# Patient Record
Sex: Female | Born: 1995 | Race: Asian | Hispanic: No | Marital: Single | Smoking: Never smoker
Health system: Southern US, Community
[De-identification: ages and names within clinical notes are randomized; demographics above are authoritative.]

## PROBLEM LIST (undated history)

## (undated) DIAGNOSIS — L42 Pityriasis rosea: Secondary | ICD-10-CM

## (undated) DIAGNOSIS — R87619 Unspecified abnormal cytological findings in specimens from cervix uteri: Secondary | ICD-10-CM

## (undated) HISTORY — PX: FINGER SURGERY: SHX640

## (undated) HISTORY — DX: Pityriasis rosea: L42

## (undated) HISTORY — DX: Unspecified abnormal cytological findings in specimens from cervix uteri: R87.619

---
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
## 2015-01-25 ENCOUNTER — Other Ambulatory Visit: Payer: Self-pay | Admitting: Nurse Practitioner

## 2015-01-25 NOTE — Telephone Encounter (Signed)
Medication refill request: Junel Fe Last AEX:  02/15/14 with TL Next AEX: 04/20/15 with PG Last MMG (if hormonal medication request):  Refill authorized: please advise

## 2015-04-18 ENCOUNTER — Other Ambulatory Visit: Payer: Self-pay | Admitting: Certified Nurse Midwife

## 2015-04-18 NOTE — Telephone Encounter (Signed)
Medication refill request: Junel FE 1mg  - 20 mcg Last AEX:  02/15/2014 [facility] Next AEX: 04/20/2015 PG Last MMG (if hormonal medication request): None Refill authorized: 01/25/2015 Junel FE 1mg -20mcg # 84 tabs 0 Refills  DL   Today: 84 tabs 0 Refills

## 2015-04-20 ENCOUNTER — Ambulatory Visit (INDEPENDENT_AMBULATORY_CARE_PROVIDER_SITE_OTHER): Payer: BLUE CROSS/BLUE SHIELD | Admitting: Nurse Practitioner

## 2015-04-20 ENCOUNTER — Encounter: Payer: Self-pay | Admitting: Nurse Practitioner

## 2015-04-20 VITALS — BP 104/70 | HR 84 | Ht 62.0 in | Wt 120.0 lb

## 2015-04-20 DIAGNOSIS — Z113 Encounter for screening for infections with a predominantly sexual mode of transmission: Secondary | ICD-10-CM | POA: Diagnosis not present

## 2015-04-20 DIAGNOSIS — Z01419 Encounter for gynecological examination (general) (routine) without abnormal findings: Secondary | ICD-10-CM | POA: Diagnosis not present

## 2015-04-20 DIAGNOSIS — Z0000 Encounter for general adult medical examination without abnormal findings: Secondary | ICD-10-CM | POA: Diagnosis not present

## 2015-04-20 LAB — POCT URINALYSIS DIPSTICK
Bilirubin, UA: NEGATIVE
Blood, UA: NEGATIVE
Glucose, UA: NEGATIVE
Ketones, UA: NEGATIVE
Leukocytes, UA: NEGATIVE
Nitrite, UA: NEGATIVE
Protein, UA: NEGATIVE
Urobilinogen, UA: NEGATIVE
pH, UA: 6

## 2015-04-20 MED ORDER — NORETHIN ACE-ETH ESTRAD-FE 1-20 MG-MCG PO TABS
1.0000 | ORAL_TABLET | Freq: Every day | ORAL | Status: DC
Start: 2015-04-20 — End: 2016-04-25

## 2015-04-20 NOTE — Progress Notes (Signed)
Patient ID: Becky Snyder, female   DOB: June 03, 1995, 19 y.o.   MRN: [id number]  19 y.o. G0P0 Single  Asian Fe here for annual exam.  Menses is lighter and last 4- 5 days. OCP has helps with cramps, still some PMS.  Same partner. Some right breast tenderness with menses and increase in caffiene. She has FCB changes bilaterally right> left.  Patient's last menstrual period was 03/30/2015 (exact date).          Sexually active: Yes.    The current method of family planning is OCP (estrogen/progesterone) and condoms most of the time.    Exercising: No.  The patient does not participate in regular exercise at present. Smoker:  no  Health Maintenance: Pap:  Never TDaP:  10/15/2007 Gardasil: completed 10/2013 Labs: will get STD's  Urine: Negative    reports that she has never smoked. She has never used smokeless tobacco. She reports that she does not drink alcohol or use illicit drugs.  History reviewed. No pertinent past medical history.  Past Surgical History  Procedure Laterality Date  . Mouth surgery  July 2015    Gum Graft    Current Outpatient Prescriptions  Medication Sig Dispense Refill  . albuterol (PROVENTIL HFA;VENTOLIN HFA) 108 (90 BASE) MCG/ACT inhaler Inhale 2 puffs into the lungs every 6 (six) hours as needed for wheezing or shortness of breath.    . EPINEPHrine 0.3 mg/0.3 mL IJ SOAJ injection Inject 0.3 mLs (0.3 mg total) into the muscle once. 1 Device prn  . Fluticasone-Salmeterol (ADVAIR DISKUS) 100-50 MCG/DOSE AEPB Inhale 1 puff into the lungs daily. 60 each 0  . loratadine (CLARITIN) 10 MG tablet Take 10 mg by mouth daily.    . norethindrone-ethinyl estradiol (JUNEL FE 1/20) 1-20 MG-MCG tablet Take 1 tablet by mouth daily. 3 Package 4   No current facility-administered medications for this visit.    Family History  Problem Relation Age of Onset  . Adopted: Yes  . Family history unknown: Yes    ROS:  Pertinent items are noted in HPI.  Otherwise, a comprehensive  ROS was negative.  Exam:   BP 104/70 mmHg  Pulse 84  Ht  (1.575 m)  Wt 120 lb (54.432 kg)  BMI 21.94 kg/m2  LMP 03/30/2015 (Exact Date) Height:  (157.5 cm) Ht Readings from Last 3 Encounters:  04/20/15  (1.575 m) (18 %*, Z = -0.90)  12/29/14 5' 2.25" (1.581 m) (21 %*, Z = -0.80)  11/02/14 5' 2.25" (1.581 m) (21 %*, Z = -0.79)   * Growth percentiles are based on CDC 2-20 Years data.    General appearance: alert, cooperative and appears stated age Head: Normocephalic, without obvious abnormality, atraumatic Neck: no adenopathy, supple, symmetrical, trachea midline and thyroid normal to inspection and palpation Lungs: clear to auscultation bilaterally Breasts: normal appearance, no masses or tenderness, with FCB chnages R> L.  Right breast is also a size larger than the left.   The area of previous concern is not palpable. Heart: regular rate and rhythm Abdomen: soft, non-tender; no masses,  no organomegaly Extremities: extremities normal, atraumatic, no cyanosis or edema Skin: Skin color, texture, turgor normal. No rashes or lesions Lymph nodes: Cervical, supraclavicular, and axillary nodes normal. No abnormal inguinal nodes palpated Neurologic: Grossly normal   Pelvic: External genitalia:  no lesions              Urethra:  normal appearing urethra with no masses, tenderness or lesions  Bartholin's and Skene's: normal                 Vagina: normal appearing vagina with normal color and discharge, no lesions              Cervix: anteverted              Pap taken: No. Bimanual Exam:  Uterus:  normal size, contour, position, consistency, mobility, non-tender              Adnexa: no mass, fullness, tenderness               Rectovaginal: Confirms               Anus:  normal sphincter tone, no lesions  Chaperone present: yes  A:  Well Woman with normal exam  OCP for contraception  R/O STD's   P:   Reviewed health and wellness pertinent to  exam  Pap smear as above  Refill on Loestrin for a year  Continue to monitor changes with her breast  Counseled on breast self exam, use and side effects of OCP's, adequate intake of calcium and vitamin D, diet and exercise return annually or prn  An After Visit Summary was printed and given to the patient.

## 2015-04-20 NOTE — Patient Instructions (Signed)
General topics  Next pap or exam is  due in 1 year Take a Women's multivitamin Take 1200 mg. of calcium daily - prefer dietary If any concerns in interim to call back  Breast Self-Awareness Practicing breast self-awareness may pick up problems early, prevent significant medical complications, and possibly save your life. By practicing breast self-awareness, you can become familiar with how your breasts look and feel and if your breasts are changing. This allows you to notice changes early. It can also offer you some reassurance that your breast health is good. One way to learn what is normal for your breasts and whether your breasts are changing is to do a breast self-exam. If you find a lump or something that was not present in the past, it is best to contact your caregiver right away. Other findings that should be evaluated by your caregiver include nipple discharge, especially if it is bloody; skin changes or reddening; areas where the skin seems to be pulled in (retracted); or new lumps and bumps. Breast pain is seldom associated with cancer (malignancy), but should also be evaluated by a caregiver. BREAST SELF-EXAM The best time to examine your breasts is 5 7 days after your menstrual period is over.  ExitCare Patient Information 2013 [employer].   Exercise to Stay Healthy Exercise helps you become and stay healthy. EXERCISE IDEAS AND TIPS Choose exercises that:  You enjoy.  Fit into your day. You do not need to exercise really hard to be healthy. You can do exercises at a slow or medium level and stay healthy. You can:  Stretch before and after working out.  Try yoga, Pilates, or tai chi.  Lift weights.  Walk fast, swim, jog, run, climb stairs, bicycle, dance, or rollerskate.  Take aerobic classes. Exercises that burn about 150 calories:  Running 1  miles in 15 minutes.  Playing volleyball for 45 to 60 minutes.  Washing and waxing a car for 45 to 60  minutes.  Playing touch football for 45 minutes.  Walking 1  miles in 35 minutes.  Pushing a stroller 1  miles in 30 minutes.  Playing basketball for 30 minutes.  Raking leaves for 30 minutes.  Bicycling 5 miles in 30 minutes.  Walking 2 miles in 30 minutes.  Dancing for 30 minutes.  Shoveling snow for 15 minutes.  Swimming laps for 20 minutes.  Walking up stairs for 15 minutes.  Bicycling 4 miles in 15 minutes.  Gardening for 30 to 45 minutes.  Jumping rope for 15 minutes.  Washing windows or floors for 45 to 60 minutes. Document Released: 05/31/2010 Document Revised: 07/21/2011 Document Reviewed: 05/31/2010 ExitCare Patient Information 2013 [employer].   Other topics ( that may be useful information):    Sexually Transmitted Disease Sexually transmitted disease (STD) refers to any infection that is passed from person to person during sexual activity. This may happen by way of saliva, semen, blood, vaginal mucus, or urine. Common STDs include:  Gonorrhea.  Chlamydia.  Syphilis.  HIV/AIDS.  Genital herpes.  Hepatitis B and C.  Trichomonas.  Human papillomavirus (HPV).  Pubic lice. CAUSES  An STD may be spread by bacteria, virus, or parasite. A person can get an STD by:  Sexual intercourse with an infected person.  Sharing sex toys with an infected person.  Sharing needles with an infected person.  Having intimate contact with the genitals, mouth, or rectal areas of an infected person. SYMPTOMS  Some people may not have any symptoms, but   they can still pass the infection to others. Different STDs have different symptoms. Symptoms include:  Painful or bloody urination.  Pain in the pelvis, abdomen, vagina, anus, throat, or eyes.  Skin rash, itching, irritation, growths, or sores (lesions). These usually occur in the genital or anal area.  Abnormal vaginal discharge.  Penile discharge in men.  Soft, flesh-colored skin growths in the  genital or anal area.  Fever.  Pain or bleeding during sexual intercourse.  Swollen glands in the groin area.  Yellow skin and eyes (jaundice). This is seen with hepatitis. DIAGNOSIS  To make a diagnosis, your caregiver may:  Take a medical history.  Perform a physical exam.  Take a specimen (culture) to be examined.  Examine a sample of discharge under a microscope.  Perform blood test TREATMENT   Chlamydia, gonorrhea, trichomonas, and syphilis can be cured with antibiotic medicine.  Genital herpes, hepatitis, and HIV can be treated, but not cured, with prescribed medicines. The medicines will lessen the symptoms.  Genital warts from HPV can be treated with medicine or by freezing, burning (electrocautery), or surgery. Warts may come back.  HPV is a virus and cannot be cured with medicine or surgery.However, abnormal areas may be followed very closely by your caregiver and may be removed from the cervix, vagina, or vulva through office procedures or surgery. If your diagnosis is confirmed, your recent sexual partners need treatment. This is true even if they are symptom-free or have a negative culture or evaluation. They should not have sex until their caregiver says it is okay. HOME CARE INSTRUCTIONS  All sexual partners should be informed, tested, and treated for all STDs.  Take your antibiotics as directed. Finish them even if you start to feel better.  Only take over-the-counter or prescription medicines for pain, discomfort, or fever as directed by your caregiver.  Rest.  Eat a balanced diet and drink enough fluids to keep your urine clear or pale yellow.  Do not have sex until treatment is completed and you have followed up with your caregiver. STDs should be checked after treatment.  Keep all follow-up appointments, Pap tests, and blood tests as directed by your caregiver.  Only use latex condoms and water-soluble lubricants during sexual activity. Do not use  petroleum jelly or oils.  Avoid alcohol and illegal drugs.  Get vaccinated for HPV and hepatitis. If you have not received these vaccines in the past, talk to your caregiver about whether one or both might be right for you.  Avoid risky sex practices that can break the skin. The only way to avoid getting an STD is to avoid all sexual activity.Latex condoms and dental dams (for oral sex) will help lessen the risk of getting an STD, but will not completely eliminate the risk. SEEK MEDICAL CARE IF:   You have a fever.  You have any new or worsening symptoms. Document Released: 07/19/2002 Document Revised: 07/21/2011 Document Reviewed: 07/26/2010 [facility] Patient Information 2013 [employer].    Domestic Abuse You are being battered or abused if someone close to you hits, pushes, or physically hurts you in any way. You also are being abused if you are forced into activities. You are being sexually abused if you are forced to have sexual contact of any kind. You are being emotionally abused if you are made to feel worthless or if you are constantly threatened. It is important to remember that help is available. No one has the right to abuse you. PREVENTION OF FURTHER  ABUSE  Learn the warning signs of danger. This varies with situations but may include: the use of alcohol, threats, isolation from friends and family, or forced sexual contact. Leave if you feel that violence is going to occur.  If you are attacked or beaten, report it to the police so the abuse is documented. You do not have to press charges. The police can protect you while you or the attackers are leaving. Get the officer's name and badge number and a copy of the report.  Find someone you can trust and tell them what is happening to you: your caregiver, a nurse, clergy member, close friend or family member. Feeling ashamed is natural, but remember that you have done nothing wrong. No one deserves abuse. Document Released:  04/25/2000 Document Revised: 07/21/2011 Document Reviewed: 07/04/2010 ExitCare Patient Information 2013 [employer].    How Much is Too Much Alcohol? Drinking too much alcohol can cause injury, accidents, and health problems. These types of problems can include:   Car crashes.  Falls.  Family fighting (domestic violence).  Drowning.  Fights.  Injuries.  Burns.  Damage to certain organs.  Having a baby with birth defects. ONE DRINK CAN BE TOO MUCH WHEN YOU ARE:  Working.  Pregnant or breastfeeding.  Taking medicines. Ask your doctor.  Driving or planning to drive. If you or someone you know has a drinking problem, get help from a doctor.  Document Released: 02/22/2009 Document Revised: 07/21/2011 Document Reviewed: 02/22/2009 ExitCare Patient Information 2013 [employer].   Smoking Hazards Smoking cigarettes is extremely bad for your health. Tobacco smoke has over 200 known poisons in it. There are over 60 chemicals in tobacco smoke that cause cancer. Some of the chemicals found in cigarette smoke include:   Cyanide.  Benzene.  Formaldehyde.  Methanol (wood alcohol).  Acetylene (fuel used in welding torches).  Ammonia. Cigarette smoke also contains the poisonous gases nitrogen oxide and carbon monoxide.  Cigarette smokers have an increased risk of many serious medical problems and Smoking causes approximately:  90% of all lung cancer deaths in men.  80% of all lung cancer deaths in women.  90% of deaths from chronic obstructive lung disease. Compared with nonsmokers, smoking increases the risk of:  Coronary heart disease by 2 to 4 times.  Stroke by 2 to 4 times.  Men developing lung cancer by 23 times.  Women developing lung cancer by 13 times.  Dying from chronic obstructive lung diseases by 12 times.  . Smoking is the most preventable cause of death and disease in our society.  WHY IS SMOKING ADDICTIVE?  Nicotine is the chemical  agent in tobacco that is capable of causing addiction or dependence.  When you smoke and inhale, nicotine is absorbed rapidly into the bloodstream through your lungs. Nicotine absorbed through the lungs is capable of creating a powerful addiction. Both inhaled and non-inhaled nicotine may be addictive.  Addiction studies of cigarettes and spit tobacco show that addiction to nicotine occurs mainly during the teen years, when young people begin using tobacco products. WHAT ARE THE BENEFITS OF QUITTING?  There are many health benefits to quitting smoking.   Likelihood of developing cancer and heart disease decreases. Health improvements are seen almost immediately.  Blood pressure, pulse rate, and breathing patterns start returning to normal soon after quitting. QUITTING SMOKING   American Lung Association - 1-800-LUNGUSA  American Cancer Society - 1-800-ACS-2345 Document Released: 06/05/2004 Document Revised: 07/21/2011 Document Reviewed: 02/07/2009 ExitCare Patient Information 2013 ExitCare[employer]   LLC.   Stress Management Stress is a state of physical or mental tension that often results from changes in your life or normal routine. Some common causes of stress are:  Death of a loved one.  Injuries or severe illnesses.  Getting fired or changing jobs.  Moving into a new home. Other causes may be:  Sexual problems.  Business or financial losses.  Taking on a large debt.  Regular conflict with someone at home or at work.  Constant tiredness from lack of sleep. It is not just bad things that are stressful. It may be stressful to:  Win the lottery.  Get married.  Buy a new car. The amount of stress that can be easily tolerated varies from person to person. Changes generally cause stress, regardless of the types of change. Too much stress can affect your health. It may lead to physical or emotional problems. Too little stress (boredom) may also become stressful. SUGGESTIONS TO  REDUCE STRESS:  Talk things over with your family and friends. It often is helpful to share your concerns and worries. If you feel your problem is serious, you may want to get help from a professional counselor.  Consider your problems one at a time instead of lumping them all together. Trying to take care of everything at once may seem impossible. List all the things you need to do and then start with the most important one. Set a goal to accomplish 2 or 3 things each day. If you expect to do too many in a single day you will naturally fail, causing you to feel even more stressed.  Do not use alcohol or drugs to relieve stress. Although you may feel better for a short time, they do not remove the problems that caused the stress. They can also be habit forming.  Exercise regularly - at least 3 times per week. Physical exercise can help to relieve that "uptight" feeling and will relax you.  The shortest distance between despair and hope is often a good night's sleep.  Go to bed and get up on time allowing yourself time for appointments without being rushed.  Take a short "time-out" period from any stressful situation that occurs during the day. Close your eyes and take some deep breaths. Starting with the muscles in your face, tense them, hold it for a few seconds, then relax. Repeat this with the muscles in your neck, shoulders, hand, stomach, back and legs.  Take good care of yourself. Eat a balanced diet and get plenty of rest.  Schedule time for having fun. Take a break from your daily routine to relax. HOME CARE INSTRUCTIONS   Call if you feel overwhelmed by your problems and feel you can no longer manage them on your own.  Return immediately if you feel like hurting yourself or someone else. Document Released: 10/22/2000 Document Revised: 07/21/2011 Document Reviewed: 06/14/2007 ExitCare Patient Information 2013 [employer]Care[employer].  

## 2015-04-21 LAB — STD PANEL
HIV 1&2 Ab, 4th Generation: NONREACTIVE
Hepatitis B Surface Ag: NEGATIVE

## 2015-04-25 LAB — IPS N GONORRHOEA AND CHLAMYDIA BY PCR

## 2015-04-25 NOTE — Progress Notes (Signed)
Encounter reviewed Jill Jertson, MD   

## 2015-09-20 ENCOUNTER — Telehealth: Payer: Self-pay | Admitting: Nurse Practitioner

## 2015-09-20 NOTE — Telephone Encounter (Signed)
Patient request 1 refill of birth control to be called to the pharmacy on file. (Patient is going to [place]) Patient also is requesting a written prescription to take with her to [place].

## 2015-09-20 NOTE — Telephone Encounter (Signed)
Return call to Kaitlyn. °

## 2015-09-20 NOTE — Telephone Encounter (Signed)
Left message to call Kaitlyn at [phone]. 

## 2015-09-20 NOTE — Telephone Encounter (Signed)
Left message to call Albeiro Trompeter at [phone]. 

## 2015-09-21 NOTE — Telephone Encounter (Signed)
Spoke with patient. Advised letter is ready for pick up. She is agreeable and will come by the office today to pick this up. Letter placed at the front desk for patient pick up.  Routing to provider for final review. Patient agreeable to disposition. Will close encounter.

## 2015-09-21 NOTE — Telephone Encounter (Signed)
Spoke with patient. Patient states that she will be traveling out of the country to [place] for 6 weeks. Patient has her OCP filled 3 months at a time. Has 1 month left of her current rx. Will be due for a refill while she is in [place]. Requesting a 1 month supply of her OCP be sent to the pharmacy so she can pick this up before her trip. Advised she will have to pay OOP cost as insurance will not coverage an additional pack prior to 3 months. Advised she can contact her insurance company and request a travel override so that she can pick up her prescription early without a charge. She is agreeable and will contact her insurance company at this time. 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 Patient will pick this letter up once complete.

## 2015-09-24 DIAGNOSIS — Z3041 Encounter for surveillance of contraceptive pills: Secondary | ICD-10-CM | POA: Insufficient documentation

## 2015-09-24 DIAGNOSIS — J4520 Mild intermittent asthma, uncomplicated: Secondary | ICD-10-CM | POA: Insufficient documentation

## 2015-09-26 ENCOUNTER — Encounter (HOSPITAL_BASED_OUTPATIENT_CLINIC_OR_DEPARTMENT_OTHER): Payer: Self-pay

## 2015-09-26 ENCOUNTER — Emergency Department (HOSPITAL_BASED_OUTPATIENT_CLINIC_OR_DEPARTMENT_OTHER)
Admission: EM | Admit: 2015-09-26 | Discharge: 2015-09-26 | Disposition: A | Discharge status: Home / Self Care | Payer: BLUE CROSS/BLUE SHIELD | Attending: Emergency Medicine | Admitting: Emergency Medicine

## 2015-09-26 DIAGNOSIS — R519 Headache, unspecified: Secondary | ICD-10-CM

## 2015-09-26 DIAGNOSIS — R51 Headache: Secondary | ICD-10-CM | POA: Insufficient documentation

## 2015-09-26 DIAGNOSIS — R109 Unspecified abdominal pain: Secondary | ICD-10-CM | POA: Diagnosis not present

## 2015-09-26 MED ORDER — IBUPROFEN 600 MG PO TABS: 600.0000 mg | ORAL_TABLET | Freq: Four times a day (QID) | ORAL | Status: AC | PRN

## 2015-09-26 MED FILL — IBUPROFEN 600 MG TABLET: 600 | 7 days supply | Qty: 30 | Fill #0

## 2015-09-26 NOTE — ED Notes (Signed)
HA with light sensitivity yesterday-HA lasted from approx 730pm-went to bed-woke without HA this am-pt states she was unable to drive yesterday and required assist getting up stairs-pt NAD-steady gait

## 2015-09-26 NOTE — ED Notes (Signed)
Pt concerned about s/s she had after her migraine. Just has a small amt of pressure to head and behind eyes at present. Neuro WNL.

## 2015-09-26 NOTE — ED Provider Notes (Signed)
CSN: [id number]     Arrival date & time 09/26/15  1147 History   First MD Initiated Contact with Patient 09/26/15 1215     Chief Complaint  Patient presents with  . Headache     (Consider location/radiation/quality/duration/timing/severity/associated sxs/prior Treatment) HPI Patient presents today for evaluation of a headache that occurred yesterday. Patient states that yesterday evening she started having pressure-like head pain that was initially frontal and then extended generally. She had associated photophobia, abdominal pain and vertigo with the headache. She then developed some nausea with no emesis. Her headache prevented her from driving home. She took some Tylenol which did not help with her symptoms. By the time she went to sleep, around 12 AM, her headache had improved and resolved by the time she woke up. She reports no associated fevers, rhinorrhea, sneezing, coughing, neck pain. Patient is sexually active with men and women and uses condoms every time. She also uses birth control. She reports not having a history of secretory transmitted infections. No abnormal vaginal discharge.   History reviewed. No pertinent past medical history. Past Surgical History  Procedure Laterality Date  . Mouth surgery  July 2015    Gum Graft   Family History  Problem Relation Age of Onset  . Adopted: Yes  . Family history unknown: Yes   Social History  Substance Use Topics  . Smoking status: Never Smoker   . Smokeless tobacco: Never Used  . Alcohol Use: No   OB History    Gravida Para Term Preterm AB TAB SAB Ectopic Multiple Living   0 0 0 0 0 0 0 0 0 0      Review of Systems  Constitutional: Negative for fever and chills.  HENT: Negative for rhinorrhea, sneezing and sore throat.   Cardiovascular: Negative for chest pain and palpitations.  Gastrointestinal: Positive for abdominal pain.  Neurological: Positive for dizziness and headaches. Negative for seizures, syncope and speech  difficulty.  All other systems reviewed and are negative.     Allergies  Review of patient's allergies indicates no known allergies.  Home Medications   Prior to Admission medications   Medication Sig Start Date End Date Taking? Authorizing Provider  albuterol (PROVENTIL HFA;VENTOLIN HFA) 108 (90 BASE) MCG/ACT inhaler Inhale 2 puffs into the lungs every 6 (six) hours as needed for wheezing or shortness of breath.    Historical Provider, MD  EPINEPHrine 0.3 mg/0.3 mL IJ SOAJ injection Inject 0.3 mLs (0.3 mg total) into the muscle once. 12/29/14   Ria CommentPatricia Grubb, FNP  Fluticasone-Salmeterol (ADVAIR DISKUS) 100-50 MCG/DOSE AEPB Inhale 1 puff into the lungs daily. 12/29/14   Ria CommentPatricia Grubb, FNP  ibuprofen (ADVIL,MOTRIN) 600 MG tablet Take 1 tablet (600 mg total) by mouth every 6 (six) hours as needed. 09/26/15   Narda Bondsalph A Rumor Sun, MD  loratadine (CLARITIN) 10 MG tablet Take 10 mg by mouth daily.    Historical Provider, MD  norethindrone-ethinyl estradiol (JUNEL FE 1/20) 1-20 MG-MCG tablet Take 1 tablet by mouth daily. 04/20/15   Ria CommentPatricia Grubb, FNP   BP 99/60 mmHg  Pulse 63  Temp(Src) 98.8 F (37.1 C) (Oral)  Resp 18  Ht 5\' 2"  (1.575 m)  Wt 53.071 kg  BMI 21.39 kg/m2  SpO2 97%  LMP 09/14/2015 Physical Exam  Constitutional: She is oriented to person, place, and time. She appears well-developed and well-nourished.  HENT:  Head: Normocephalic.  Eyes: Conjunctivae are normal. Pupils are equal, round, and reactive to light.  Neck: Normal range of motion and full  passive range of motion without pain. Neck supple. No rigidity. Normal range of motion present.  Cardiovascular: Normal rate and regular rhythm.   Pulmonary/Chest: Effort normal and breath sounds normal. No respiratory distress. She has no wheezes. She has no rales.  Abdominal: Soft. Bowel sounds are normal. She exhibits no distension. There is no tenderness. There is no rebound and no guarding.  Musculoskeletal: Normal range of  motion. She exhibits no edema or tenderness.  Lymphadenopathy:    She has no cervical adenopathy.  Neurological: She is alert and oriented to person, place, and time. She has normal strength and normal reflexes. No cranial nerve deficit. She exhibits normal muscle tone. Coordination normal.  Skin: Skin is warm and dry.    ED Course  Procedures (including critical care time) Labs Review Labs Reviewed - No data to display  Imaging Review No results found. I have personally reviewed and evaluated these images and lab results as part of my medical decision-making.   EKG Interpretation None      MDM   Final diagnoses:  Acute nonintractable headache, unspecified headache type   Patient without neurological findings. Headache is currently resolved. Likely migraine in nature. No evidence for acute meningitis. Patient overall looks very well. Will provide prescription for ibuprofen 600 mg to use for headache. Patient agrees with plan. Stable for discharge home.  Narda Bonds, MD 09/26/15 1449  Marily Memos, MD 09/26/15 [phone]

## 2015-09-26 NOTE — Discharge Instructions (Signed)
Her symptoms sound like he might of had a migraine headache. With your evaluation, did not appear that you have signs consistent with meningitis. We discussed red flags including worsening headache, fever, nausea, vomiting, neck stiffness. Please follow-up with her primary care physician for continued management or the ED if symptoms worsen.

## 2016-04-25 ENCOUNTER — Encounter: Payer: Self-pay | Admitting: Nurse Practitioner

## 2016-04-25 ENCOUNTER — Ambulatory Visit (INDEPENDENT_AMBULATORY_CARE_PROVIDER_SITE_OTHER): Payer: BLUE CROSS/BLUE SHIELD | Admitting: Nurse Practitioner

## 2016-04-25 VITALS — BP 110/66 | HR 80 | Ht 62.0 in | Wt 121.0 lb

## 2016-04-25 DIAGNOSIS — Z0000 Encounter for general adult medical examination without abnormal findings: Secondary | ICD-10-CM | POA: Diagnosis not present

## 2016-04-25 DIAGNOSIS — Z01411 Encounter for gynecological examination (general) (routine) with abnormal findings: Secondary | ICD-10-CM | POA: Diagnosis not present

## 2016-04-25 DIAGNOSIS — K602 Anal fissure, unspecified: Secondary | ICD-10-CM

## 2016-04-25 LAB — POCT URINALYSIS DIPSTICK
Bilirubin, UA: NEGATIVE
Glucose, UA: NEGATIVE
Ketones, UA: NEGATIVE
Leukocytes, UA: NEGATIVE
Nitrite, UA: NEGATIVE
Protein, UA: NEGATIVE
Urobilinogen, UA: NEGATIVE
pH, UA: 6

## 2016-04-25 LAB — HEMOGLOBIN, FINGERSTICK: Hemoglobin, fingerstick: 13.2 g/dL (ref 12.0–15.0)

## 2016-04-25 MED ORDER — NORETHIN ACE-ETH ESTRAD-FE 1-20 MG-MCG PO TABS: 1.0000 | ORAL_TABLET | Freq: Every day | ORAL | 4 refills | Status: DC

## 2016-04-25 MED ORDER — PRAMOXINE HCL 1 % RE FOAM: 1.0000 "application " | Freq: Three times a day (TID) | RECTAL | 0 refills | Status: DC | PRN

## 2016-04-25 NOTE — Progress Notes (Signed)
Patient ID: Becky Snyder, female   DOB: 1996-01-02, 20 y.o.   MRN: [id number]  20 y.o. G0P0000 Single  Asian Fe here for annual exam.  Menses normally 4-5 days.  Flow heavy X 2 the moderate to light.  Cramps and relief with OTC NSAID's.  Some PMS.  Same partner for 2.5 months.  STD's were done 3 weeks at school and was negative.  She has been having BRRB X 3 times associated with constipated BM.  Then this week seemed to be more painful on passing stool even without straining.  She denies abdominal pain, mucous, diarrhea, weakness or fatigue.  Denies rectal intercourse.   Patient's last menstrual period was 04/25/2016.          Sexually active: Yes.    The current method of family planning is OCP (estrogen/progesterone) and condoms sometimes.  Female and female partners - (currently no female partner) Exercising: Yes.    running and tae kwondo Smoker:  no  Health Maintenance: Pap: none per guidelines TDaP: 10/15/07 HIV: 04/20/15 and 3 weeks ago Labs: HB: 13.2  Urine: trace RBC   reports that she has never smoked. She has never used smokeless tobacco. She reports that she uses drugs, including Marijuana. She reports that she does not drink alcohol.  History reviewed. No pertinent past medical history.  Past Surgical History:  Procedure Laterality Date  . MOUTH SURGERY  July 2015   Gum Graft    Current Outpatient Prescriptions  Medication Sig Dispense Refill  . albuterol (PROVENTIL HFA;VENTOLIN HFA) 108 (90 BASE) MCG/ACT inhaler Inhale 2 puffs into the lungs every 6 (six) hours as needed for wheezing or shortness of breath.    . EPINEPHrine 0.3 mg/0.3 mL IJ SOAJ injection Inject 0.3 mLs (0.3 mg total) into the muscle once. 1 Device prn  . Fluticasone-Salmeterol (ADVAIR DISKUS) 100-50 MCG/DOSE AEPB Inhale 1 puff into the lungs daily. 60 each 0  . ibuprofen (ADVIL,MOTRIN) 600 MG tablet Take 1 tablet (600 mg total) by mouth every 6 (six) hours as needed. 30 tablet 0  . loratadine  (CLARITIN) 10 MG tablet Take 10 mg by mouth daily.    . norethindrone-ethinyl estradiol (JUNEL FE 1/20) 1-20 MG-MCG tablet Take 1 tablet by mouth daily. 3 Package 4   No current facility-administered medications for this visit.     Family History  Problem Relation Age of Onset  . Adopted: Yes  . Family history unknown: Yes    ROS:  Pertinent items are noted in HPI.  Otherwise, a comprehensive ROS was negative.  Exam:   BP 110/66 (BP Location: Right Arm, Patient Position: Sitting, Cuff Size: Normal)   Pulse 80   Ht 5\' 2"  (1.575 m)   Wt 121 lb (54.9 kg)   LMP 04/25/2016   BMI 22.13 kg/m  Height: 5\' 2"  (157.5 cm) Ht Readings from Last 3 Encounters:  04/25/16 5\' 2"  (1.575 m)  09/26/15 5\' 2"  (1.575 m) (18 %, Z= -0.90)*  04/20/15 5\' 2"  (1.575 m) (18 %, Z= -0.90)*   * Growth percentiles are based on CDC 2-20 Years data.    General appearance: alert, cooperative and appears stated age Head: Normocephalic, without obvious abnormality, atraumatic Neck: no adenopathy, supple, symmetrical, trachea midline and thyroid normal to inspection and palpation Lungs: clear to auscultation bilaterally Breasts: normal appearance, no masses or tenderness Heart: regular rate and rhythm Abdomen: soft, non-tender; no masses,  no organomegaly Extremities: extremities normal, atraumatic, no cyanosis or edema Skin: Skin color, texture, turgor normal. No  rashes or lesions Lymph nodes: Cervical, supraclavicular, and axillary nodes normal. No abnormal inguinal nodes palpated Neurologic: Grossly normal   Pelvic: External genitalia:  no lesions              Urethra:  normal appearing urethra with no masses, tenderness or lesions              Bartholin's and Skene's: normal                 Vagina: normal appearing vagina with normal color and discharge, no lesions              Cervix: anteverted              Pap taken: No. Bimanual Exam:  Uterus:  normal size, contour, position, consistency, mobility,  non-tender              Adnexa: no mass, fullness, tenderness               Rectovaginal: Confirms               Anus:  normal sphincter tone, no lesions see procedure below  Anal scope Procedure:  With pt consent she was placed in left decubitus position.  Area was inspected with a small external skin tag.  The anal scope was then inserted with lubrication and a small fissure is noted at 3:00 position and a slightly larger one at 9:00 position 1 cm into the rectum.  Both areas very friable and most likely the area of bleeding.  No interna hemorrhoids and no other rectal mass was noted.  Tolerated the procedure well.  Chaperone present: yes  A:  Well Woman with normal exam  OCP for contraception  Recent STD's at school 3 weeks ago were negative  Female and female relationships  Recent BRRB with constipation.   P:   Reviewed health and wellness pertinent to exam  Pap smear not done  Reviewed stool softeners and need to avoid constipation  She is given RX of Proctofoam to aid in healing the rectal fissures.   She is given instructions for care and if any symptoms worsen to call back ASAP.  Refill on OCP X 1 yr.  Counseled on breast self exam, STD prevention, HIV risk factors and prevention, use and side effects of OCP's, adequate intake of calcium and vitamin D, diet and exercise return annually or prn  An After Visit Summary was printed and given to the patient.

## 2016-04-25 NOTE — Patient Instructions (Signed)
General topics  Next pap or exam is  due in 1 year Take a Women's multivitamin Take 1200 mg. of calcium daily - prefer dietary If any concerns in interim to call back  Breast Self-Awareness Practicing breast self-awareness may pick up problems early, prevent significant medical complications, and possibly save your life. By practicing breast self-awareness, you can become familiar with how your breasts look and feel and if your breasts are changing. This allows you to notice changes early. It can also offer you some reassurance that your breast health is good. One way to learn what is normal for your breasts and whether your breasts are changing is to do a breast self-exam. If you find a lump or something that was not present in the past, it is best to contact your caregiver right away. Other findings that should be evaluated by your caregiver include nipple discharge, especially if it is bloody; skin changes or reddening; areas where the skin seems to be pulled in (retracted); or new lumps and bumps. Breast pain is seldom associated with cancer (malignancy), but should also be evaluated by a caregiver. BREAST SELF-EXAM The best time to examine your breasts is 5 7 days after your menstrual period is over.  ExitCare Patient Information 2013 [employer].   Exercise to Stay Healthy Exercise helps you become and stay healthy. EXERCISE IDEAS AND TIPS Choose exercises that:  You enjoy.  Fit into your day. You do not need to exercise really hard to be healthy. You can do exercises at a slow or medium level and stay healthy. You can:  Stretch before and after working out.  Try yoga, Pilates, or tai chi.  Lift weights.  Walk fast, swim, jog, run, climb stairs, bicycle, dance, or rollerskate.  Take aerobic classes. Exercises that burn about 150 calories:  Running 1  miles in 15 minutes.  Playing volleyball for 45 to 60 minutes.  Washing and waxing a car for 45 to 60  minutes.  Playing touch football for 45 minutes.  Walking 1  miles in 35 minutes.  Pushing a stroller 1  miles in 30 minutes.  Playing basketball for 30 minutes.  Raking leaves for 30 minutes.  Bicycling 5 miles in 30 minutes.  Walking 2 miles in 30 minutes.  Dancing for 30 minutes.  Shoveling snow for 15 minutes.  Swimming laps for 20 minutes.  Walking up stairs for 15 minutes.  Bicycling 4 miles in 15 minutes.  Gardening for 30 to 45 minutes.  Jumping rope for 15 minutes.  Washing windows or floors for 45 to 60 minutes. Document Released: 05/31/2010 Document Revised: 07/21/2011 Document Reviewed: 05/31/2010 ExitCare Patient Information 2013 [employer].   Other topics ( that may be useful information):    Sexually Transmitted Disease Sexually transmitted disease (STD) refers to any infection that is passed from person to person during sexual activity. This may happen by way of saliva, semen, blood, vaginal mucus, or urine. Common STDs include:  Gonorrhea.  Chlamydia.  Syphilis.  HIV/AIDS.  Genital herpes.  Hepatitis B and C.  Trichomonas.  Human papillomavirus (HPV).  Pubic lice. CAUSES  An STD may be spread by bacteria, virus, or parasite. A person can get an STD by:  Sexual intercourse with an infected person.  Sharing sex toys with an infected person.  Sharing needles with an infected person.  Having intimate contact with the genitals, mouth, or rectal areas of an infected person. SYMPTOMS  Some people may not have any symptoms, but   they can still pass the infection to others. Different STDs have different symptoms. Symptoms include:  Painful or bloody urination.  Pain in the pelvis, abdomen, vagina, anus, throat, or eyes.  Skin rash, itching, irritation, growths, or sores (lesions). These usually occur in the genital or anal area.  Abnormal vaginal discharge.  Penile discharge in men.  Soft, flesh-colored skin growths in the  genital or anal area.  Fever.  Pain or bleeding during sexual intercourse.  Swollen glands in the groin area.  Yellow skin and eyes (jaundice). This is seen with hepatitis. DIAGNOSIS  To make a diagnosis, your caregiver may:  Take a medical history.  Perform a physical exam.  Take a specimen (culture) to be examined.  Examine a sample of discharge under a microscope.  Perform blood test TREATMENT   Chlamydia, gonorrhea, trichomonas, and syphilis can be cured with antibiotic medicine.  Genital herpes, hepatitis, and HIV can be treated, but not cured, with prescribed medicines. The medicines will lessen the symptoms.  Genital warts from HPV can be treated with medicine or by freezing, burning (electrocautery), or surgery. Warts may come back.  HPV is a virus and cannot be cured with medicine or surgery.However, abnormal areas may be followed very closely by your caregiver and may be removed from the cervix, vagina, or vulva through office procedures or surgery. If your diagnosis is confirmed, your recent sexual partners need treatment. This is true even if they are symptom-free or have a negative culture or evaluation. They should not have sex until their caregiver says it is okay. HOME CARE INSTRUCTIONS  All sexual partners should be informed, tested, and treated for all STDs.  Take your antibiotics as directed. Finish them even if you start to feel better.  Only take over-the-counter or prescription medicines for pain, discomfort, or fever as directed by your caregiver.  Rest.  Eat a balanced diet and drink enough fluids to keep your urine clear or pale yellow.  Do not have sex until treatment is completed and you have followed up with your caregiver. STDs should be checked after treatment.  Keep all follow-up appointments, Pap tests, and blood tests as directed by your caregiver.  Only use latex condoms and water-soluble lubricants during sexual activity. Do not use  petroleum jelly or oils.  Avoid alcohol and illegal drugs.  Get vaccinated for HPV and hepatitis. If you have not received these vaccines in the past, talk to your caregiver about whether one or both might be right for you.  Avoid risky sex practices that can break the skin. The only way to avoid getting an STD is to avoid all sexual activity.Latex condoms and dental dams (for oral sex) will help lessen the risk of getting an STD, but will not completely eliminate the risk. SEEK MEDICAL CARE IF:   You have a fever.  You have any new or worsening symptoms. Document Released: 07/19/2002 Document Revised: 07/21/2011 Document Reviewed: 07/26/2010 [facility] Patient Information 2013 [place].    Domestic Abuse You are being battered or abused if someone close to you hits, pushes, or physically hurts you in any way. You also are being abused if you are forced into activities. You are being sexually abused if you are forced to have sexual contact of any kind. You are being emotionally abused if you are made to feel worthless or if you are constantly threatened. It is important to remember that help is available. No one has the right to abuse you. PREVENTION OF FURTHER  ABUSE  Learn the warning signs of danger. This varies with situations but may include: the use of alcohol, threats, isolation from friends and family, or forced sexual contact. Leave if you feel that violence is going to occur.  If you are attacked or beaten, report it to the police so the abuse is documented. You do not have to press charges. The police can protect you while you or the attackers are leaving. Get the officer's name and badge number and a copy of the report.  Find someone you can trust and tell them what is happening to you: your caregiver, a nurse, clergy member, close friend or family member. Feeling ashamed is natural, but remember that you have done nothing wrong. No one deserves abuse. Document Released:  04/25/2000 Document Revised: 07/21/2011 Document Reviewed: 07/04/2010 ExitCare Patient Information 2013 [employer].    How Much is Too Much Alcohol? Drinking too much alcohol can cause injury, accidents, and health problems. These types of problems can include:   Car crashes.  Falls.  Family fighting (domestic violence).  Drowning.  Fights.  Injuries.  Burns.  Damage to certain organs.  Having a baby with birth defects. ONE DRINK CAN BE TOO MUCH WHEN YOU ARE:  Working.  Pregnant or breastfeeding.  Taking medicines. Ask your doctor.  Driving or planning to drive. If you or someone you know has a drinking problem, get help from a doctor.  Document Released: 02/22/2009 Document Revised: 07/21/2011 Document Reviewed: 02/22/2009 ExitCare Patient Information 2013 [employer].   Smoking Hazards Smoking cigarettes is extremely bad for your health. Tobacco smoke has over 200 known poisons in it. There are over 60 chemicals in tobacco smoke that cause cancer. Some of the chemicals found in cigarette smoke include:   Cyanide.  Benzene.  Formaldehyde.  Methanol (wood alcohol).  Acetylene (fuel used in welding torches).  Ammonia. Cigarette smoke also contains the poisonous gases nitrogen oxide and carbon monoxide.  Cigarette smokers have an increased risk of many serious medical problems and Smoking causes approximately:  90% of all lung cancer deaths in men.  80% of all lung cancer deaths in women.  90% of deaths from chronic obstructive lung disease. Compared with nonsmokers, smoking increases the risk of:  Coronary heart disease by 2 to 4 times.  Stroke by 2 to 4 times.  Men developing lung cancer by 23 times.  Women developing lung cancer by 13 times.  Dying from chronic obstructive lung diseases by 12 times.  . Smoking is the most preventable cause of death and disease in our society.  WHY IS SMOKING ADDICTIVE?  Nicotine is the chemical  agent in tobacco that is capable of causing addiction or dependence.  When you smoke and inhale, nicotine is absorbed rapidly into the bloodstream through your lungs. Nicotine absorbed through the lungs is capable of creating a powerful addiction. Both inhaled and non-inhaled nicotine may be addictive.  Addiction studies of cigarettes and spit tobacco show that addiction to nicotine occurs mainly during the teen years, when young people begin using tobacco products. WHAT ARE THE BENEFITS OF QUITTING?  There are many health benefits to quitting smoking.   Likelihood of developing cancer and heart disease decreases. Health improvements are seen almost immediately.  Blood pressure, pulse rate, and breathing patterns start returning to normal soon after quitting. QUITTING SMOKING   American Lung Association - 1-800-LUNGUSA  American Cancer Society - 1-800-ACS-2345 Document Released: 06/05/2004 Document Revised: 07/21/2011 Document Reviewed: 02/07/2009 ExitCare Patient Information 2013 ExitCare,   LL[employer].   Stress Management Stress is a state of physical or mental tension that often results from changes in your life or normal routine. Some common causes of stress are:  Death of a loved one.  Injuries or severe illnesses.  Getting fired or changing jobs.  Moving into a new home. Other causes may be:  Sexual problems.  Business or financial losses.  Taking on a large debt.  Regular conflict with someone at home or at work.  Constant tiredness from lack of sleep. It is not just bad things that are stressful. It may be stressful to:  Win the lottery.  Get married.  Buy a new car. The amount of stress that can be easily tolerated varies from person to person. Changes generally cause stress, regardless of the types of change. Too much stress can affect your health. It may lead to physical or emotional problems. Too little stress (boredom) may also become stressful. SUGGESTIONS TO  REDUCE STRESS:  Talk things over with your family and friends. It often is helpful to share your concerns and worries. If you feel your problem is serious, you may want to get help from a professional counselor.  Consider your problems one at a time instead of lumping them all together. Trying to take care of everything at once may seem impossible. List all the things you need to do and then start with the most important one. Set a goal to accomplish 2 or 3 things each day. If you expect to do too many in a single day you will naturally fail, causing you to feel even more stressed.  Do not use alcohol or drugs to relieve stress. Although you may feel better for a short time, they do not remove the problems that caused the stress. They can also be habit forming.  Exercise regularly - at least 3 times per week. Physical exercise can help to relieve that "uptight" feeling and will relax you.  The shortest distance between despair and hope is often a good night's sleep.  Go to bed and get up on time allowing yourself time for appointments without being rushed.  Take a short "time-out" period from any stressful situation that occurs during the day. Close your eyes and take some deep breaths. Starting with the muscles in your face, tense them, hold it for a few seconds, then relax. Repeat this with the muscles in your neck, shoulders, hand, stomach, back and legs.  Take good care of yourself. Eat a balanced diet and get plenty of rest.  Schedule time for having fun. Take a break from your daily routine to relax. HOME CARE INSTRUCTIONS   Call if you feel overwhelmed by your problems and feel you can no longer manage them on your own.  Return immediately if you feel like hurting yourself or someone else. Document Released: 10/22/2000 Document Revised: 07/21/2011 Document Reviewed: 06/14/2007 [facility] Patient Information 2013 [place].    Anal Fissure, Adult Introduction An anal fissure  is a small tear or crack in the skin around the opening of the butt (anus).Bleeding from the tear or crack usually stops on its own within a few minutes. The bleeding may happen every time you poop (have a bowel movement) until the tear or crack heals. Follow these instructions at home: Eating and drinking  Avoid bananas and dairy products. These foods can make it hard to poop.  Drink enough fluid to keep your pee (urine) clear or pale yellow.  Eat a lot of  fruit, whole grains, and vegetables. General instructions  Keep the butt area as clean and dry as you can.  Take a warm water bath (sitz bath) as told by your doctor. Do not use soap.  Take over-the-counter and prescription medicines only as told by your doctor.  Use creams or ointments only as told by your doctor.  Keep all follow-up visits as told by your doctor. This is important. Contact a doctor if:  You have more bleeding.  You have a fever.  You have watery poop (diarrhea) that is mixed with blood.  You have pain.  You problem gets worse, not better. This information is not intended to replace advice given to you by your health care provider. Make sure you discuss any questions you have with your health care provider. Document Released: 12/25/2010 Document Revised: 10/04/2015 Document Reviewed: 07/24/2014  2017 [employer]

## 2016-04-27 NOTE — Progress Notes (Signed)
Encounter reviewed by Dr. Brook Amundson C. Silva.  

## 2016-06-17 ENCOUNTER — Other Ambulatory Visit: Payer: Self-pay | Admitting: Nurse Practitioner

## 2016-06-24 ENCOUNTER — Other Ambulatory Visit: Payer: Self-pay | Admitting: Nurse Practitioner

## 2016-06-24 ENCOUNTER — Ambulatory Visit (INDEPENDENT_AMBULATORY_CARE_PROVIDER_SITE_OTHER): Payer: BLUE CROSS/BLUE SHIELD | Admitting: Psychology

## 2016-06-24 DIAGNOSIS — F4323 Adjustment disorder with mixed anxiety and depressed mood: Secondary | ICD-10-CM | POA: Diagnosis not present

## 2016-06-24 NOTE — Telephone Encounter (Signed)
Patient advised pharmacy has prescription that was sent on 04/25/16 #3 Packages 4R.

## 2016-06-24 NOTE — Telephone Encounter (Signed)
Patient is requesting a refill for Junel. Fe

## 2016-07-21 ENCOUNTER — Ambulatory Visit: Payer: BLUE CROSS/BLUE SHIELD | Admitting: Psychology

## 2016-08-11 ENCOUNTER — Ambulatory Visit: Payer: BLUE CROSS/BLUE SHIELD | Admitting: Psychology

## 2016-08-22 ENCOUNTER — Ambulatory Visit (INDEPENDENT_AMBULATORY_CARE_PROVIDER_SITE_OTHER): Payer: BLUE CROSS/BLUE SHIELD | Admitting: Psychology

## 2016-08-22 DIAGNOSIS — F4323 Adjustment disorder with mixed anxiety and depressed mood: Secondary | ICD-10-CM

## 2016-09-19 ENCOUNTER — Ambulatory Visit (INDEPENDENT_AMBULATORY_CARE_PROVIDER_SITE_OTHER): Payer: BLUE CROSS/BLUE SHIELD | Admitting: Psychology

## 2016-09-19 DIAGNOSIS — F4323 Adjustment disorder with mixed anxiety and depressed mood: Secondary | ICD-10-CM | POA: Diagnosis not present

## 2016-12-17 ENCOUNTER — Ambulatory Visit (INDEPENDENT_AMBULATORY_CARE_PROVIDER_SITE_OTHER): Payer: BLUE CROSS/BLUE SHIELD | Admitting: Psychology

## 2016-12-17 DIAGNOSIS — F4323 Adjustment disorder with mixed anxiety and depressed mood: Secondary | ICD-10-CM

## 2017-01-21 ENCOUNTER — Ambulatory Visit (INDEPENDENT_AMBULATORY_CARE_PROVIDER_SITE_OTHER): Payer: BLUE CROSS/BLUE SHIELD | Admitting: Psychology

## 2017-01-21 DIAGNOSIS — F4323 Adjustment disorder with mixed anxiety and depressed mood: Secondary | ICD-10-CM | POA: Diagnosis not present

## 2017-02-17 ENCOUNTER — Ambulatory Visit: Payer: Self-pay | Admitting: Psychology

## 2017-02-25 ENCOUNTER — Ambulatory Visit (INDEPENDENT_AMBULATORY_CARE_PROVIDER_SITE_OTHER): Payer: BLUE CROSS/BLUE SHIELD | Admitting: Psychology

## 2017-02-25 DIAGNOSIS — F4323 Adjustment disorder with mixed anxiety and depressed mood: Secondary | ICD-10-CM | POA: Diagnosis not present

## 2017-03-24 ENCOUNTER — Ambulatory Visit (INDEPENDENT_AMBULATORY_CARE_PROVIDER_SITE_OTHER): Payer: BLUE CROSS/BLUE SHIELD | Admitting: Psychology

## 2017-03-24 DIAGNOSIS — F4323 Adjustment disorder with mixed anxiety and depressed mood: Secondary | ICD-10-CM

## 2017-04-28 ENCOUNTER — Other Ambulatory Visit (HOSPITAL_COMMUNITY)
Admission: RE | Admit: 2017-04-28 | Discharge: 2017-04-28 | Disposition: A | Discharge status: Home / Self Care | Payer: BLUE CROSS/BLUE SHIELD | Source: Ambulatory Visit | Attending: Certified Nurse Midwife | Admitting: Certified Nurse Midwife

## 2017-04-28 ENCOUNTER — Encounter: Payer: Self-pay | Admitting: Certified Nurse Midwife

## 2017-04-28 ENCOUNTER — Ambulatory Visit: Payer: BLUE CROSS/BLUE SHIELD | Admitting: Certified Nurse Midwife

## 2017-04-28 ENCOUNTER — Other Ambulatory Visit: Payer: Self-pay

## 2017-04-28 ENCOUNTER — Ambulatory Visit: Payer: BLUE CROSS/BLUE SHIELD | Admitting: Nurse Practitioner

## 2017-04-28 VITALS — BP 100/64 | HR 64 | Resp 16 | Ht 62.25 in | Wt 126.0 lb

## 2017-04-28 DIAGNOSIS — Z3041 Encounter for surveillance of contraceptive pills: Secondary | ICD-10-CM

## 2017-04-28 DIAGNOSIS — Z01419 Encounter for gynecological examination (general) (routine) without abnormal findings: Secondary | ICD-10-CM

## 2017-04-28 DIAGNOSIS — A630 Anogenital (venereal) warts: Secondary | ICD-10-CM | POA: Diagnosis not present

## 2017-04-28 DIAGNOSIS — Z124 Encounter for screening for malignant neoplasm of cervix: Secondary | ICD-10-CM | POA: Diagnosis not present

## 2017-04-28 MED ORDER — NORETHIN ACE-ETH ESTRAD-FE 1-20 MG-MCG PO TABS: 1.0000 | ORAL_TABLET | Freq: Every day | ORAL | 4 refills | Status: DC

## 2017-04-28 NOTE — Progress Notes (Signed)
21 y.o. G0P0000 Single  Asian Fe here for annual exam. Periods normal , no issues. OCP working well. No partner change or STD screening needed. Has noted a "lump" in vaginal area, no tenderness, or discharge or pain from area. Please check.. Has noted in blood in stool with wiping, but was seen for this and was diagnosed with hemorrhoid. Currently treating cold and sees PCP for asthma management [facility]). Just finished finals and now working on last year of college. No other health issues today.  Patient's last menstrual period was 04/24/2017 (exact date).          Sexually active: Yes.    The current method of family planning is OCP (estrogen/progesterone).    Exercising: Yes.    walking, running & biking Smoker:  no  Health Maintenance: Pap:  none History of Abnormal Pap: no MMG:  none Self Breast exams: yes Colonoscopy:  none BMD:   none TDaP:  2009 Shingles: no Pneumonia: no Hep C and HIV: HIV neg 2017 Labs: if needed   reports that  has never smoked. she has never used smokeless tobacco. She reports that she drinks about 1.8 oz of alcohol per week. She reports that she uses drugs. Drug: Marijuana.  History reviewed. No pertinent past medical history.  Past Surgical History:  Procedure Laterality Date  . MOUTH SURGERY  July 2015   Gum Graft    Current Outpatient Medications  Medication Sig Dispense Refill  . albuterol (PROVENTIL HFA;VENTOLIN HFA) 108 (90 BASE) MCG/ACT inhaler Inhale 2 puffs into the lungs every 6 (six) hours as needed for wheezing or shortness of breath.    . Fluticasone-Salmeterol (ADVAIR DISKUS) 100-50 MCG/DOSE AEPB Inhale 1 puff into the lungs daily. 60 each 0  . ibuprofen (ADVIL,MOTRIN) 600 MG tablet Take 1 tablet (600 mg total) by mouth every 6 (six) hours as needed. 30 tablet 0  . loratadine (CLARITIN) 10 MG tablet Take 10 mg by mouth daily.    . norethindrone-ethinyl estradiol (JUNEL FE 1/20) 1-20 MG-MCG tablet Take 1 tablet by  mouth daily. 3 Package 4  . EPINEPHrine 0.3 mg/0.3 mL IJ SOAJ injection Inject 0.3 mLs (0.3 mg total) into the muscle once. (Patient not taking: Reported on 04/28/2017) 1 Device prn   No current facility-administered medications for this visit.     Family History  Adopted: Yes  Family history unknown: Yes    ROS:  Pertinent items are noted in HPI.  Otherwise, a comprehensive ROS was negative.  Exam:   BP 100/64   Pulse 64   Resp 16   Ht 5' 2.25" (1.581 m)   Wt 126 lb (57.2 kg)   LMP 04/24/2017 (Exact Date)   BMI 22.86 kg/m  Height: 5' 2.25" (158.1 cm) Ht Readings from Last 3 Encounters:  04/28/17 5' 2.25" (1.581 m)  04/25/16 5\' 2"  (1.575 m)  09/26/15 5\' 2"  (1.575 m) (18 %, Z= -0.90)*   * Growth percentiles are based on CDC (Girls, 2-20 Years) data.    General appearance: alert, cooperative and appears stated age Head: Normocephalic, without obvious abnormality, atraumatic Neck: no adenopathy, supple, symmetrical, trachea midline and thyroid normal to inspection and palpation Lungs: clear to auscultation bilaterally Breasts: normal appearance, no masses or tenderness, No nipple retraction or dimpling, No nipple discharge or bleeding, No axillary or supraclavicular adenopathy Heart: regular rate and rhythm Abdomen: soft, non-tender; no masses,  no organomegaly Extremities: extremities normal, atraumatic, no cyanosis or edema Skin: Skin color, texture, turgor normal. No  rashes or lesions Lymph nodes: Cervical, supraclavicular, and axillary nodes normal. No abnormal inguinal nodes palpated Neurologic: Grossly normal   Pelvic: External genitalia:  Normal female with ? Warty growth on right perineal body vs skin tag, no redness or tenderness              Urethra:  normal appearing urethra with no masses, tenderness or lesions              Bartholin's and Skene's: normal                 Vagina: normal appearing vagina with normal color and discharge, no lesions               Cervix: no cervical motion tenderness, no lesions and nulliparous appearance              Pap taken: Yes.   Bimanual Exam:  Uterus:  normal size, contour, position, consistency, mobility, non-tender and anteverted              Adnexa: normal adnexa and no mass, fullness, tenderness               Rectovaginal: Confirms               Anus:  normal sphincter tone, no lesions  Chaperone present: yes  A:  Well Woman with normal exam  Contraception working well desires OCP  ? Genital wart vs skin tag in perineal area  Asthma with PCP management  P:   Reviewed health and wellness pertinent to exam  Risks/benefits/warning signs reviewed, desires continuance  Rx Juenl Fe 1/20 see order with instructions  Discussed and shown findings to patient, and etiology. Discussed if genital wart can be sexually transmitted. Questions addressed. Patient will monitor appearance and if changes will come in for recheck.  Continue follow up with PCP as indicated.  Pap smear: yes   counseled on breast self exam, STD prevention, HIV risk factors and prevention, use and side effects of OCP's, adequate intake of calcium and vitamin D, diet and exercise  return annually or prn  An After Visit Summary was printed and given to the patient.

## 2017-04-28 NOTE — Patient Instructions (Signed)
General topics  Next pap or exam is  due in 1 year Take a Women's multivitamin Take 1200 mg. of calcium daily - prefer dietary If any concerns in interim to call back  Breast Self-Awareness Practicing breast self-awareness may pick up problems early, prevent significant medical complications, and possibly save your life. By practicing breast self-awareness, you can become familiar with how your breasts look and feel and if your breasts are changing. This allows you to notice changes early. It can also offer you some reassurance that your breast health is good. One way to learn what is normal for your breasts and whether your breasts are changing is to do a breast self-exam. If you find a lump or something that was not present in the past, it is best to contact your caregiver right away. Other findings that should be evaluated by your caregiver include nipple discharge, especially if it is bloody; skin changes or reddening; areas where the skin seems to be pulled in (retracted); or new lumps and bumps. Breast pain is seldom associated with cancer (malignancy), but should also be evaluated by a caregiver. BREAST SELF-EXAM The best time to examine your breasts is 5 7 days after your menstrual period is over.  ExitCare Patient Information 2013 ExitCare, LLC.   Exercise to Stay Healthy Exercise helps you become and stay healthy. EXERCISE IDEAS AND TIPS Choose exercises that:  You enjoy.  Fit into your day. You do not need to exercise really hard to be healthy. You can do exercises at a slow or medium level and stay healthy. You can:  Stretch before and after working out.  Try yoga, Pilates, or tai chi.  Lift weights.  Walk fast, swim, jog, run, climb stairs, bicycle, dance, or rollerskate.  Take aerobic classes. Exercises that burn about 150 calories:  Running 1  miles in 15 minutes.  Playing volleyball for 45 to 60 minutes.  Washing and waxing a car for 45 to 60  minutes.  Playing touch football for 45 minutes.  Walking 1  miles in 35 minutes.  Pushing a stroller 1  miles in 30 minutes.  Playing basketball for 30 minutes.  Raking leaves for 30 minutes.  Bicycling 5 miles in 30 minutes.  Walking 2 miles in 30 minutes.  Dancing for 30 minutes.  Shoveling snow for 15 minutes.  Swimming laps for 20 minutes.  Walking up stairs for 15 minutes.  Bicycling 4 miles in 15 minutes.  Gardening for 30 to 45 minutes.  Jumping rope for 15 minutes.  Washing windows or floors for 45 to 60 minutes. Document Released: 05/31/2010 Document Revised: 07/21/2011 Document Reviewed: 05/31/2010 ExitCare Patient Information 2013 ExitCare[employer] LLC.   Other topics ( that may be useful information):    Sexually Transmitted Disease Sexually transmitted disease (STD) refers to any infection that is passed from person to person during sexual activity. This may happen by way of saliva, semen, blood, vaginal mucus, or urine. Common STDs include:  Gonorrhea.  Chlamydia.  Syphilis.  HIV/AIDS.  Genital herpes.  Hepatitis B and C.  Trichomonas.  Human papillomavirus (HPV).  Pubic lice. CAUSES  An STD may be spread by bacteria, virus, or parasite. A person can get an STD by:  Sexual intercourse with an infected person.  Sharing sex toys with an infected person.  Sharing needles with an infected person.  Having intimate contact with the genitals, mouth, or rectal areas of an infected person. SYMPTOMS  Some people may not have any symptoms, but   they can still pass the infection to others. Different STDs have different symptoms. Symptoms include:  Painful or bloody urination.  Pain in the pelvis, abdomen, vagina, anus, throat, or eyes.  Skin rash, itching, irritation, growths, or sores (lesions). These usually occur in the genital or anal area.  Abnormal vaginal discharge.  Penile discharge in men.  Soft, flesh-colored skin growths in the  genital or anal area.  Fever.  Pain or bleeding during sexual intercourse.  Swollen glands in the groin area.  Yellow skin and eyes (jaundice). This is seen with hepatitis. DIAGNOSIS  To make a diagnosis, your caregiver may:  Take a medical history.  Perform a physical exam.  Take a specimen (culture) to be examined.  Examine a sample of discharge under a microscope.  Perform blood test TREATMENT   Chlamydia, gonorrhea, trichomonas, and syphilis can be cured with antibiotic medicine.  Genital herpes, hepatitis, and HIV can be treated, but not cured, with prescribed medicines. The medicines will lessen the symptoms.  Genital warts from HPV can be treated with medicine or by freezing, burning (electrocautery), or surgery. Warts may come back.  HPV is a virus and cannot be cured with medicine or surgery.However, abnormal areas may be followed very closely by your caregiver and may be removed from the cervix, vagina, or vulva through office procedures or surgery. If your diagnosis is confirmed, your recent sexual partners need treatment. This is true even if they are symptom-free or have a negative culture or evaluation. They should not have sex until their caregiver says it is okay. HOME CARE INSTRUCTIONS  All sexual partners should be informed, tested, and treated for all STDs.  Take your antibiotics as directed. Finish them even if you start to feel better.  Only take over-the-counter or prescription medicines for pain, discomfort, or fever as directed by your caregiver.  Rest.  Eat a balanced diet and drink enough fluids to keep your urine clear or pale yellow.  Do not have sex until treatment is completed and you have followed up with your caregiver. STDs should be checked after treatment.  Keep all follow-up appointments, Pap tests, and blood tests as directed by your caregiver.  Only use latex condoms and water-soluble lubricants during sexual activity. Do not use  petroleum jelly or oils.  Avoid alcohol and illegal drugs.  Get vaccinated for HPV and hepatitis. If you have not received these vaccines in the past, talk to your caregiver about whether one or both might be right for you.  Avoid risky sex practices that can break the skin. The only way to avoid getting an STD is to avoid all sexual activity.Latex condoms and dental dams (for oral sex) will help lessen the risk of getting an STD, but will not completely eliminate the risk. SEEK MEDICAL CARE IF:   You have a fever.  You have any new or worsening symptoms. Document Released: 07/19/2002 Document Revised: 07/21/2011 Document Reviewed: 07/26/2010 [facility] Patient Information 2013 [place].    Domestic Abuse You are being battered or abused if someone close to you hits, pushes, or physically hurts you in any way. You also are being abused if you are forced into activities. You are being sexually abused if you are forced to have sexual contact of any kind. You are being emotionally abused if you are made to feel worthless or if you are constantly threatened. It is important to remember that help is available. No one has the right to abuse you. PREVENTION OF FURTHER  ABUSE  Learn the warning signs of danger. This varies with situations but may include: the use of alcohol, threats, isolation from friends and family, or forced sexual contact. Leave if you feel that violence is going to occur.  If you are attacked or beaten, report it to the police so the abuse is documented. You do not have to press charges. The police can protect you while you or the attackers are leaving. Get the officer's name and badge number and a copy of the report.  Find someone you can trust and tell them what is happening to you: your caregiver, a nurse, clergy member, close friend or family member. Feeling ashamed is natural, but remember that you have done nothing wrong. No one deserves abuse. Document Released:  04/25/2000 Document Revised: 07/21/2011 Document Reviewed: 07/04/2010 ExitCare Patient Information 2013 ExitCare, LLC.    How Much is Too Much Alcohol? Drinking too much alcohol can cause injury, accidents, and health problems. These types of problems can include:   Car crashes.  Falls.  Family fighting (domestic violence).  Drowning.  Fights.  Injuries.  Burns.  Damage to certain organs.  Having a baby with birth defects. ONE DRINK CAN BE TOO MUCH WHEN YOU ARE:  Working.  Pregnant or breastfeeding.  Taking medicines. Ask your doctor.  Driving or planning to drive. If you or someone you know has a drinking problem, get help from a doctor.  Document Released: 02/22/2009 Document Revised: 07/21/2011 Document Reviewed: 02/22/2009 ExitCare Patient Information 2013 ExitCare, LLC.   Smoking Hazards Smoking cigarettes is extremely bad for your health. Tobacco smoke has over 200 known poisons in it. There are over 60 chemicals in tobacco smoke that cause cancer. Some of the chemicals found in cigarette smoke include:   Cyanide.  Benzene.  Formaldehyde.  Methanol (wood alcohol).  Acetylene (fuel used in welding torches).  Ammonia. Cigarette smoke also contains the poisonous gases nitrogen oxide and carbon monoxide.  Cigarette smokers have an increased risk of many serious medical problems and Smoking causes approximately:  90% of all lung cancer deaths in men.  80% of all lung cancer deaths in women.  90% of deaths from chronic obstructive lung disease. Compared with nonsmokers, smoking increases the risk of:  Coronary heart disease by 2 to 4 times.  Stroke by 2 to 4 times.  Men developing lung cancer by 23 times.  Women developing lung cancer by 13 times.  Dying from chronic obstructive lung diseases by 12 times.  . Smoking is the most preventable cause of death and disease in our society.  WHY IS SMOKING ADDICTIVE?  Nicotine is the chemical  agent in tobacco that is capable of causing addiction or dependence.  When you smoke and inhale, nicotine is absorbed rapidly into the bloodstream through your lungs. Nicotine absorbed through the lungs is capable of creating a powerful addiction. Both inhaled and non-inhaled nicotine may be addictive.  Addiction studies of cigarettes and spit tobacco show that addiction to nicotine occurs mainly during the teen years, when young people begin using tobacco products. WHAT ARE THE BENEFITS OF QUITTING?  There are many health benefits to quitting smoking.   Likelihood of developing cancer and heart disease decreases. Health improvements are seen almost immediately.  Blood pressure, pulse rate, and breathing patterns start returning to normal soon after quitting. QUITTING SMOKING   American Lung Association - 1-800-LUNGUSA  American Cancer Society - 1-800-ACS-2345 Document Released: 06/05/2004 Document Revised: 07/21/2011 Document Reviewed: 02/07/2009 ExitCare Patient Information 2013 ExitCare,   LLC.   Stress Management Stress is a state of physical or mental tension that often results from changes in your life or normal routine. Some common causes of stress are:  Death of a loved one.  Injuries or severe illnesses.  Getting fired or changing jobs.  Moving into a new home. Other causes may be:  Sexual problems.  Business or financial losses.  Taking on a large debt.  Regular conflict with someone at home or at work.  Constant tiredness from lack of sleep. It is not just bad things that are stressful. It may be stressful to:  Win the lottery.  Get married.  Buy a new car. The amount of stress that can be easily tolerated varies from person to person. Changes generally cause stress, regardless of the types of change. Too much stress can affect your health. It may lead to physical or emotional problems. Too little stress (boredom) may also become stressful. SUGGESTIONS TO  REDUCE STRESS:  Talk things over with your family and friends. It often is helpful to share your concerns and worries. If you feel your problem is serious, you may want to get help from a professional counselor.  Consider your problems one at a time instead of lumping them all together. Trying to take care of everything at once may seem impossible. List all the things you need to do and then start with the most important one. Set a goal to accomplish 2 or 3 things each day. If you expect to do too many in a single day you will naturally fail, causing you to feel even more stressed.  Do not use alcohol or drugs to relieve stress. Although you may feel better for a short time, they do not remove the problems that caused the stress. They can also be habit forming.  Exercise regularly - at least 3 times per week. Physical exercise can help to relieve that "uptight" feeling and will relax you.  The shortest distance between despair and hope is often a good night's sleep.  Go to bed and get up on time allowing yourself time for appointments without being rushed.  Take a short "time-out" period from any stressful situation that occurs during the day. Close your eyes and take some deep breaths. Starting with the muscles in your face, tense them, hold it for a few seconds, then relax. Repeat this with the muscles in your neck, shoulders, hand, stomach, back and legs.  Take good care of yourself. Eat a balanced diet and get plenty of rest.  Schedule time for having fun. Take a break from your daily routine to relax. HOME CARE INSTRUCTIONS   Call if you feel overwhelmed by your problems and feel you can no longer manage them on your own.  Return immediately if you feel like hurting yourself or someone else. Document Released: 10/22/2000 Document Revised: 07/21/2011 Document Reviewed: 06/14/2007 [facility] Patient Information 2013 [place].   Genital Warts Genital warts are small growths in the  genital area or anal area. They are caused by a type of germ (HPV virus). This germ is spread from person to person during sex. It can be spread through vaginal, anal, and oral sex. Genital warts can lead to other problems if they are not treated. Follow these instructions at home: Medicines  Apply over-the-counter and prescription medicines only as told by your doctor.  Do not use medicines that are meant for treating hand warts.  Talk with your doctor about using anti-itch creams. General  instructions  Do not touch or scratch the warts.  Do not have sex until your treatment is done.  Tell your current and past sexual partners about your condition. They may need treatment.  Keep all follow-up visits as told by your doctor. This is important.  After treatment, use condoms during sex. Other Instructions for Women  Women who have genital warts may need to be checked more often for cervical cancer.  If you become pregnant, tell your doctor that you have had genital warts. The germ can be passed to the baby. Contact a doctor if:  You have redness, swelling, or pain in the area of the treated skin.  You have a fever.  You feel generally sick.  You feel lumps in and around your genital area or anal area.  You have bleeding in your genital area or anal area.  You have pain during sex. This information is not intended to replace advice given to you by your health care provider. Make sure you discuss any questions you have with your health care provider. Document Released: 07/23/2009 Document Revised: 10/04/2015 Document Reviewed: 07/24/2014 Elsevier Interactive Patient Education  [employer].

## 2017-04-30 ENCOUNTER — Ambulatory Visit: Payer: BLUE CROSS/BLUE SHIELD | Admitting: Psychology

## 2017-05-07 LAB — CYTOLOGY - PAP
Diagnosis: UNDETERMINED — AB
HPV: DETECTED — AB

## 2017-06-10 ENCOUNTER — Ambulatory Visit: Payer: BLUE CROSS/BLUE SHIELD | Admitting: Psychology

## 2017-06-17 ENCOUNTER — Telehealth: Payer: Self-pay | Admitting: Certified Nurse Midwife

## 2017-06-17 NOTE — Telephone Encounter (Signed)
erro  neous encounter

## 2017-07-01 ENCOUNTER — Ambulatory Visit (INDEPENDENT_AMBULATORY_CARE_PROVIDER_SITE_OTHER): Payer: BLUE CROSS/BLUE SHIELD | Admitting: Psychology

## 2017-07-01 DIAGNOSIS — F4323 Adjustment disorder with mixed anxiety and depressed mood: Secondary | ICD-10-CM | POA: Diagnosis not present

## 2017-08-13 ENCOUNTER — Ambulatory Visit (INDEPENDENT_AMBULATORY_CARE_PROVIDER_SITE_OTHER): Payer: BLUE CROSS/BLUE SHIELD | Admitting: Psychology

## 2017-08-13 DIAGNOSIS — F4323 Adjustment disorder with mixed anxiety and depressed mood: Secondary | ICD-10-CM | POA: Diagnosis not present

## 2018-01-01 DIAGNOSIS — R5383 Other fatigue: Secondary | ICD-10-CM | POA: Insufficient documentation

## 2018-01-26 ENCOUNTER — Ambulatory Visit (INDEPENDENT_AMBULATORY_CARE_PROVIDER_SITE_OTHER): Payer: BLUE CROSS/BLUE SHIELD | Admitting: Psychology

## 2018-01-26 DIAGNOSIS — F4323 Adjustment disorder with mixed anxiety and depressed mood: Secondary | ICD-10-CM | POA: Diagnosis not present

## 2018-04-28 NOTE — Progress Notes (Signed)
22 y.o. G0P0000 Single  Asian Fe here for annual exam. Periods normal, no issues. Occasional missed pills, but took as soon as she remembers in the same day. Partner change and would like STD screening. Sees [facility] once yearly for exam and labs. Continues with anal fissure occurrence, treats with diet and resolves. Still having some skin flares, manages with dermatology. No other health issues today. Will be going to grad school in [place] next year!  Patient's last menstrual period was 04/27/2018 (exact date).          Sexually active: Yes.    The current method of family planning is OCP (estrogen/progesterone).    Exercising: Yes.    walking & biking Smoker:  no  Review of Systems  Constitutional: Negative.   HENT: Negative.   Eyes: Negative.   Respiratory: Negative.   Cardiovascular: Negative.   Gastrointestinal: Negative.   Genitourinary: Negative.   Musculoskeletal: Negative.   Skin: Negative for rash.  Neurological: Negative.   Endo/Heme/Allergies: Negative.   Psychiatric/Behavioral: Negative.     Health Maintenance: Pap:  04-28-17 ASCUS HPV HR + History of Abnormal Pap: yes MMG:  none Self Breast exams: yes Colonoscopy:  none BMD:   none TDaP:  2019 Shingles: no Pneumonia: no Hep C and HIV: HIV neg 2016 Labs: yes   reports that she has never smoked. She has never used smokeless tobacco. She reports current alcohol use of about 3.0 - 5.0 standard drinks of alcohol per week. She reports previous drug use.  Past Medical History:  Diagnosis Date  . Abnormal Pap smear of cervix    04-28-17 ASCUS HPV HR +  . Pityriasis rosea     Past Surgical History:  Procedure Laterality Date  . MOUTH SURGERY  July 2015   Gum Graft    Current Outpatient Medications  Medication Sig Dispense Refill  . albuterol (PROVENTIL HFA;VENTOLIN HFA) 108 (90 BASE) MCG/ACT inhaler Inhale 2 puffs into the lungs every 6 (six) hours as needed for wheezing or shortness of breath.    .  cetirizine (ZYRTEC) 10 MG tablet Take 10 mg by mouth daily.    . Fluticasone-Salmeterol (ADVAIR DISKUS) 100-50 MCG/DOSE AEPB Inhale 1 puff into the lungs daily. 60 each 0  . ibuprofen (ADVIL,MOTRIN) 600 MG tablet Take 1 tablet (600 mg total) by mouth every 6 (six) hours as needed. 30 tablet 0  . loratadine (CLARITIN) 10 MG tablet Take 10 mg by mouth daily.    . norethindrone-ethinyl estradiol (JUNEL FE 1/20) 1-20 MG-MCG tablet Take 1 tablet by mouth daily. 3 Package 4  . EPINEPHrine 0.3 mg/0.3 mL IJ SOAJ injection Inject 0.3 mLs (0.3 mg total) into the muscle once. (Patient not taking: Reported on 04/28/2017) 1 Device prn   No current facility-administered medications for this visit.     Family History  Adopted: Yes  Family history unknown: Yes    ROS:  Pertinent items are noted in HPI.  Otherwise, a comprehensive ROS was negative.  Exam:   BP 110/70   Pulse 64   Resp 16   Ht 5\' 2"  (1.575 m)   Wt 129 lb (58.5 kg)   LMP 04/27/2018 (Exact Date)   BMI 23.59 kg/m  Height: 5\' 2"  (157.5 cm) Ht Readings from Last 3 Encounters:  04/29/18 5\' 2"  (1.575 m)  04/28/17 5' 2.25" (1.581 m)  04/25/16 5\' 2"  (1.575 m)    General appearance: alert, cooperative and appears stated age Head: Normocephalic, without obvious abnormality, atraumatic Neck: no adenopathy,  supple, symmetrical, trachea midline and thyroid normal to inspection and palpation Lungs: clear to auscultation bilaterally Breasts: normal appearance, no masses or tenderness, No nipple retraction or dimpling, No nipple discharge or bleeding, No axillary or supraclavicular adenopathy Heart: regular rate and rhythm Abdomen: soft, non-tender; no masses,  no organomegaly Extremities: extremities normal, atraumatic, no cyanosis or edema Skin: Skin color, texture, turgor normal. No rashes or lesions Lymph nodes: Cervical, supraclavicular, and axillary nodes normal. No abnormal inguinal nodes palpated Neurologic: Grossly  normal   Pelvic: External genitalia:  no lesions              Urethra:  normal appearing urethra with no masses, tenderness or lesions              Bartholin's and Skene's: normal                 Vagina: normal appearing vagina with normal color and discharge, no lesions              Cervix: no cervical motion tenderness, no lesions and nulliparous appearance              Pap taken: Yes.   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  Rx Junel 1/20 Fe see order with instructions  Continue follow up with PCP as indicated  Labs: GC/Chlamydia, affirm  Pap smear: yes If pap negative repeat one year, if not per results   counseled on breast self exam, STD prevention, HIV risk factors and prevention, use and side effects of OCP's, adequate intake of calcium and vitamin D, diet and exercise  return annually or prn  An After Visit Summary was printed and given to the patient.

## 2018-04-29 ENCOUNTER — Encounter: Payer: Self-pay | Admitting: Certified Nurse Midwife

## 2018-04-29 ENCOUNTER — Other Ambulatory Visit: Payer: Self-pay

## 2018-04-29 ENCOUNTER — Other Ambulatory Visit (HOSPITAL_COMMUNITY)
Admission: RE | Admit: 2018-04-29 | Discharge: 2018-04-29 | Disposition: A | Discharge status: Home / Self Care | Payer: BLUE CROSS/BLUE SHIELD | Source: Ambulatory Visit | Attending: Obstetrics & Gynecology | Admitting: Obstetrics & Gynecology

## 2018-04-29 ENCOUNTER — Ambulatory Visit: Payer: BLUE CROSS/BLUE SHIELD | Admitting: Certified Nurse Midwife

## 2018-04-29 VITALS — BP 110/70 | HR 64 | Resp 16 | Ht 62.0 in | Wt 129.0 lb

## 2018-04-29 DIAGNOSIS — Z8742 Personal history of other diseases of the female genital tract: Secondary | ICD-10-CM

## 2018-04-29 DIAGNOSIS — Z01419 Encounter for gynecological examination (general) (routine) without abnormal findings: Secondary | ICD-10-CM | POA: Diagnosis not present

## 2018-04-29 DIAGNOSIS — Z113 Encounter for screening for infections with a predominantly sexual mode of transmission: Secondary | ICD-10-CM

## 2018-04-29 DIAGNOSIS — Z124 Encounter for screening for malignant neoplasm of cervix: Secondary | ICD-10-CM | POA: Diagnosis not present

## 2018-04-29 DIAGNOSIS — Z3041 Encounter for surveillance of contraceptive pills: Secondary | ICD-10-CM | POA: Diagnosis not present

## 2018-04-29 MED ORDER — NORETHIN ACE-ETH ESTRAD-FE 1-20 MG-MCG PO TABS: 1.0000 | ORAL_TABLET | Freq: Every day | ORAL | 4 refills | Status: DC

## 2018-04-30 LAB — VAGINITIS/VAGINOSIS, DNA PROBE
Candida Species: NEGATIVE
Gardnerella vaginalis: NEGATIVE
Trichomonas vaginosis: NEGATIVE

## 2018-05-01 LAB — GC/CHLAMYDIA PROBE AMP
Chlamydia trachomatis, NAA: NEGATIVE
Neisseria gonorrhoeae by PCR: NEGATIVE

## 2018-05-03 LAB — CYTOLOGY - PAP: Diagnosis: NEGATIVE

## 2018-06-11 DIAGNOSIS — S62639B Displaced fracture of distal phalanx of unspecified finger, initial encounter for open fracture: Secondary | ICD-10-CM | POA: Insufficient documentation

## 2018-11-26 ENCOUNTER — Telehealth: Payer: Self-pay | Admitting: Certified Nurse Midwife

## 2018-11-26 NOTE — Telephone Encounter (Signed)
Spoke with patient. She is complaining of vaginal discharge/irritation and some rectal bleeding. 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 Advised patient can make a problem visit but unable to make AEX appointment until after 05-10-19. Made appointment with Ms.Melvia Heaps, CNM on 11-30-18 11:30am.

## 2018-11-26 NOTE — Telephone Encounter (Signed)
Patient sent the following message through [facility]. Routing to triage to assist patient with request. No available appointments for the next week.  Hello Dr. 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 I'm having persistent vaginal discharge that is yellow/green and has lasted more than 2 weeks. Also, I've had consistent anal bleeding during bowel movements for a month (I believe fissures, not present inside the stool). Please let me know your availability as I can make any appointment.     Thank you,   Becky Snyder

## 2018-11-30 ENCOUNTER — Ambulatory Visit: Payer: BC Managed Care – PPO | Admitting: Certified Nurse Midwife

## 2018-11-30 ENCOUNTER — Encounter: Payer: Self-pay | Admitting: Certified Nurse Midwife

## 2018-11-30 ENCOUNTER — Other Ambulatory Visit: Payer: Self-pay

## 2018-11-30 VITALS — BP 106/64 | HR 68 | Temp 97.3°F | Resp 16 | Wt 134.0 lb

## 2018-11-30 DIAGNOSIS — Z3041 Encounter for surveillance of contraceptive pills: Secondary | ICD-10-CM

## 2018-11-30 DIAGNOSIS — K625 Hemorrhage of anus and rectum: Secondary | ICD-10-CM | POA: Diagnosis not present

## 2018-11-30 DIAGNOSIS — N898 Other specified noninflammatory disorders of vagina: Secondary | ICD-10-CM

## 2018-11-30 MED ORDER — NORETHIN ACE-ETH ESTRAD-FE 1-20 MG-MCG PO TABS: 1.0000 | ORAL_TABLET | Freq: Every day | ORAL | 2 refills | Status: AC

## 2018-11-30 NOTE — Progress Notes (Signed)
23 y.o. Single Asian female G0P0000 here with complaint of vaginal symptoms of increase discharge that is green or yellow discharge. Onset of symptoms one month ago and has continued. Notes odor with discharge also. 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 Denies new personal products.  Urinary symptoms none. Continues to have bleeding with each bowel movement. Taking Colace and drinking water to try to help with. No pain noted recently. Contraception is OCP and condoms. Patient will be leaving for [place] soon for Master study and will need update on OCP. She will return in 05/2019 and has aex scheduled then. No other issues today.  Review of Systems  Constitutional: Negative.   HENT: Negative.   Eyes: Negative.   Respiratory: Negative.   Cardiovascular: Negative.   Gastrointestinal: Negative.   Genitourinary: Negative.   Musculoskeletal: Negative.   Skin:       Green clumpy vaginal discharge, rectal bleeding  Neurological: Negative.   Endo/Heme/Allergies: Negative.   Psychiatric/Behavioral: Negative.     O:Healthy female WDWN Affect: normal, orientation x 3  Exam: Skin: warm and dry Abdomen:soft,non tender  Inguinal Lymph nodes: no enlargement or tenderness Pelvic exam: External genital: normal female BUS: negative Vagina: brown, red period  discharge noted.  Affirm taken Cervix: normal, non tender, no CMT Uterus: normal, non tender Adnexa:normal, non tender, no masses or fullness noted Rectal exam with Anal scope, very small friable area noted in rectum, ? Fissure, slightly tender.  A:Normal pelvic exam R/O vaginal infection due to discharge Rectal bleeding   P:Discussed findings of period like blood noted in vagina, no yellow or green discharge noted.Discussed pelvic exam normal and will await lab results and treat if indicated. Discussed Aveeno or baking soda sitz bath for comfort and odor if needed. Lab: affirm Discussed rectal  finding and feel with bleeding with every bowel movement she should see GI. Will refer to Dr. Collene Mares.  Discussed update of Rx for contraception, one additional refill of 3. Rx: Junel Fe 1/20 see order with instructions.   Rv prn

## 2018-12-01 ENCOUNTER — Other Ambulatory Visit: Payer: Self-pay

## 2018-12-01 LAB — VAGINITIS/VAGINOSIS, DNA PROBE
Candida Species: POSITIVE — AB
Gardnerella vaginalis: NEGATIVE
Trichomonas vaginosis: NEGATIVE

## 2018-12-01 MED ORDER — FLUCONAZOLE 150 MG PO TABS: ORAL_TABLET | ORAL | 0 refills | Status: DC

## 2018-12-26 ENCOUNTER — Other Ambulatory Visit: Payer: Self-pay | Admitting: Certified Nurse Midwife

## 2018-12-26 DIAGNOSIS — B373 Candidiasis of vulva and vagina: Secondary | ICD-10-CM

## 2018-12-26 DIAGNOSIS — B3731 Acute candidiasis of vulva and vagina: Secondary | ICD-10-CM

## 2018-12-26 MED ORDER — FLUCONAZOLE 150 MG PO TABS: ORAL_TABLET | ORAL | 0 refills | Status: AC

## 2019-05-18 ENCOUNTER — Ambulatory Visit: Payer: BLUE CROSS/BLUE SHIELD | Admitting: Certified Nurse Midwife

## 2019-07-25 ENCOUNTER — Encounter: Payer: Self-pay | Admitting: Certified Nurse Midwife

## 2019-07-27 ENCOUNTER — Encounter: Payer: Self-pay | Admitting: Certified Nurse Midwife

## 2023-11-27 ENCOUNTER — Encounter: Payer: Self-pay | Admitting: Advanced Practice Midwife
